# Patient Record
Sex: Female | Born: 1975 | Race: White | Hispanic: No | Marital: Married | Smoking: Never smoker
Health system: Southern US, Community
[De-identification: ages and names within clinical notes are randomized; demographics above are authoritative.]

## PROBLEM LIST (undated history)

## (undated) DIAGNOSIS — R7303 Prediabetes: Secondary | ICD-10-CM

## (undated) DIAGNOSIS — Z9889 Other specified postprocedural states: Secondary | ICD-10-CM

## (undated) DIAGNOSIS — I10 Essential (primary) hypertension: Secondary | ICD-10-CM

---

## 2011-01-10 ENCOUNTER — Ambulatory Visit: Payer: Self-pay | Admitting: Internal Medicine

## 2013-07-29 IMAGING — CT CT ABD-PELV W/ CM
1 of 3 series · 14 of 32 positions shown, 19 images · non-contrast
Comparison: none

REASON FOR EXAM: rlq pain abn CT
COMMENTS:

PROCEDURE:     CT  - CT ABDOMEN / PELVIS  W  - January 10, 2011  [DATE]
RESULT:     CT abdomen and pelvis dated [id number].
TECHNIQUE: Helical 3 mm sections were obtained from the lung bases through
the pubic symphysis status post intravenous administration of 100 mL of
Dsovue-9YP and oral contrast.

[Series 2: 3mm soft tissue · axial · 0.77mm/px · z∈[-538,-102]mm · 14 of 165 slices shown, 19 images]
[im 10/165  soft-tissue]
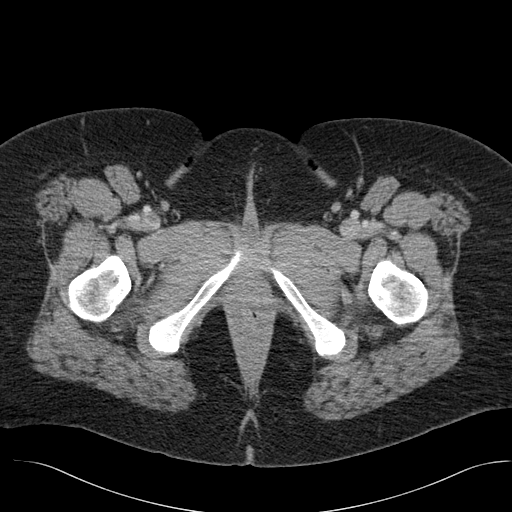
[im 10/165  bone]
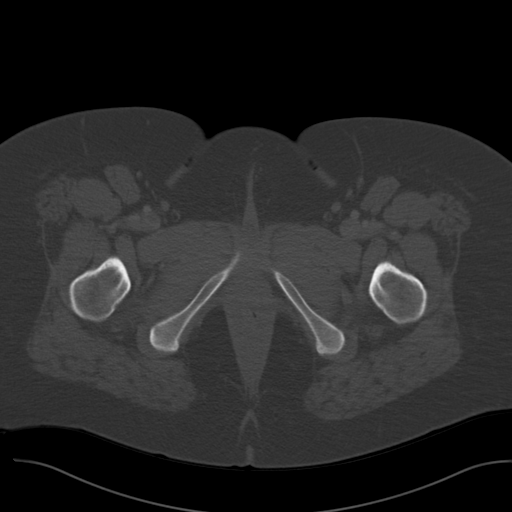
[im 20/165  soft-tissue]
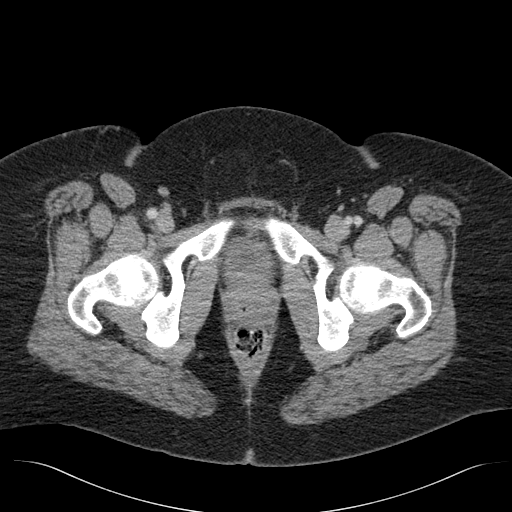
[im 39/165  soft-tissue]
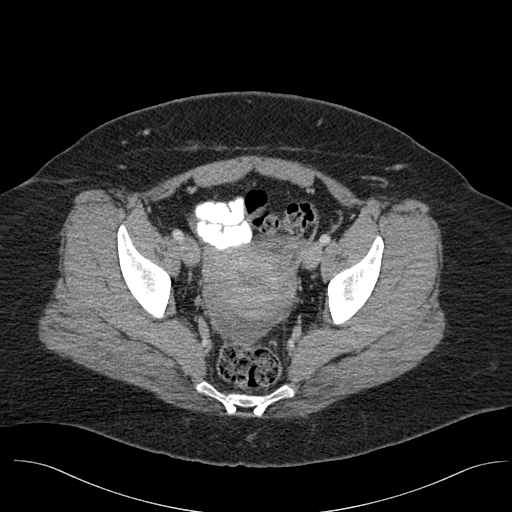
[im 49/165  soft-tissue]
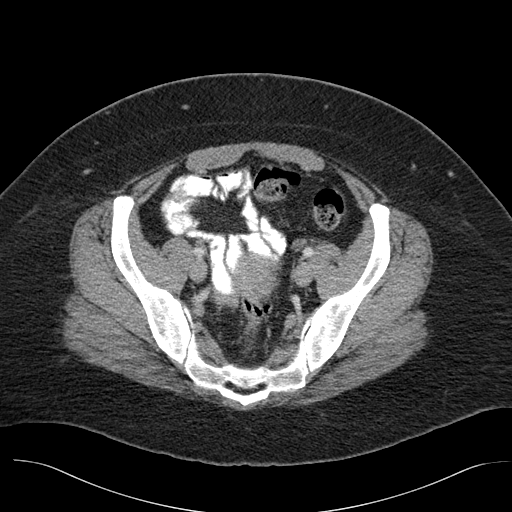
[im 58/165  soft-tissue]
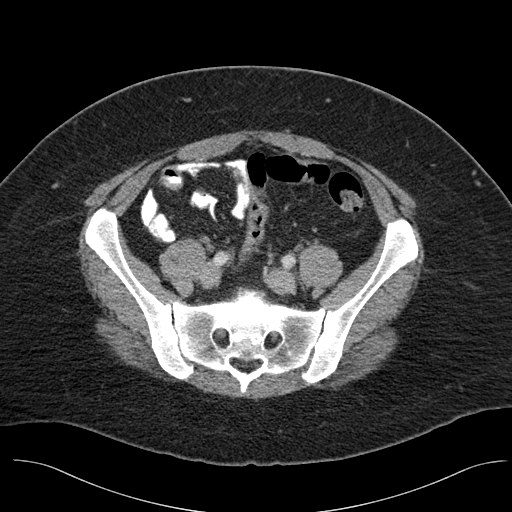
[im 68/165  soft-tissue]
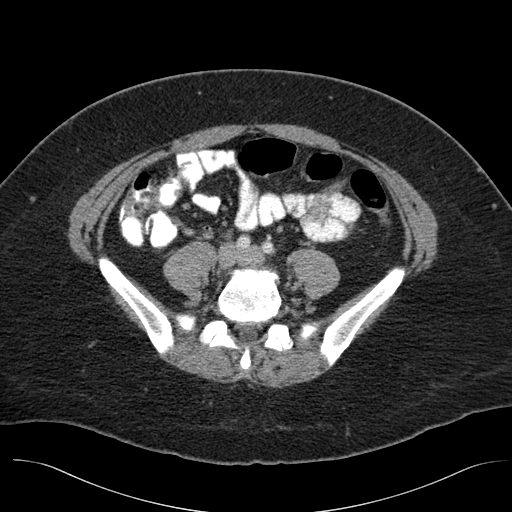
[im 87/165  soft-tissue]
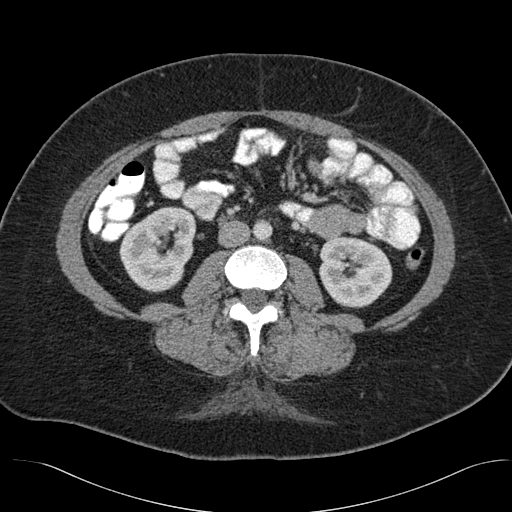
[im 97/165  soft-tissue]
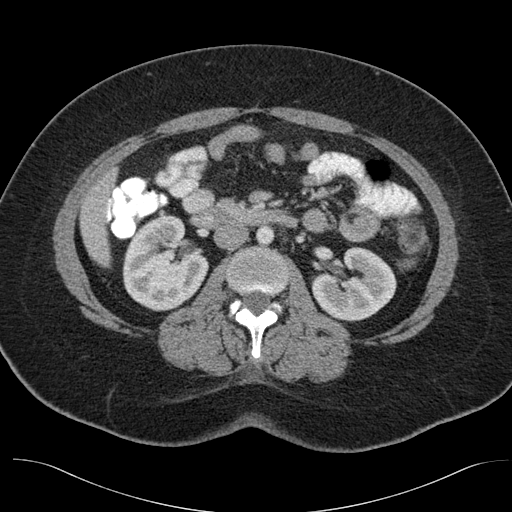
[im 107/165  soft-tissue]
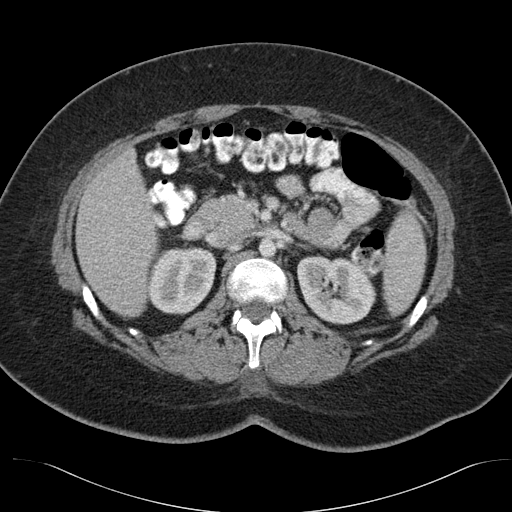
[im 107/165  bone]
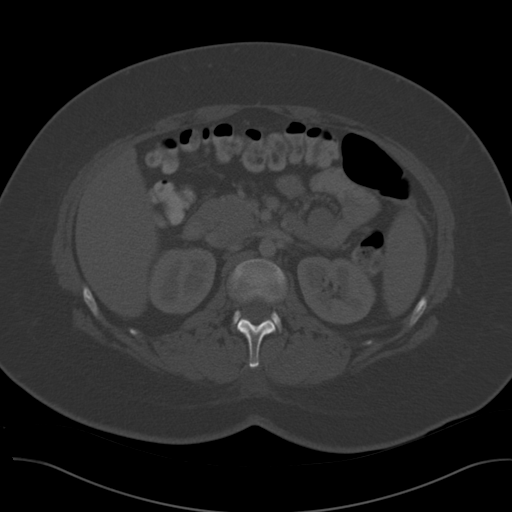
[im 116/165  soft-tissue]
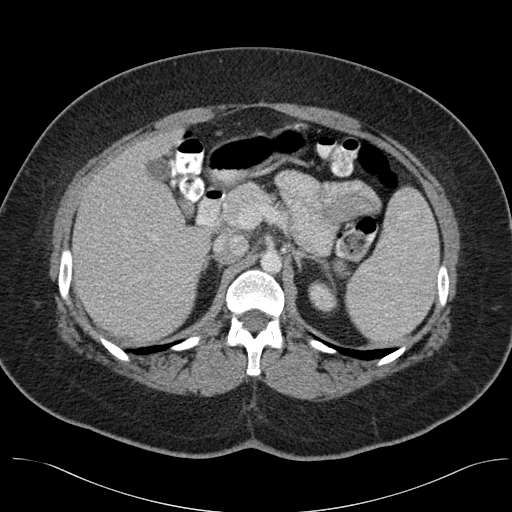
[im 126/165  soft-tissue]
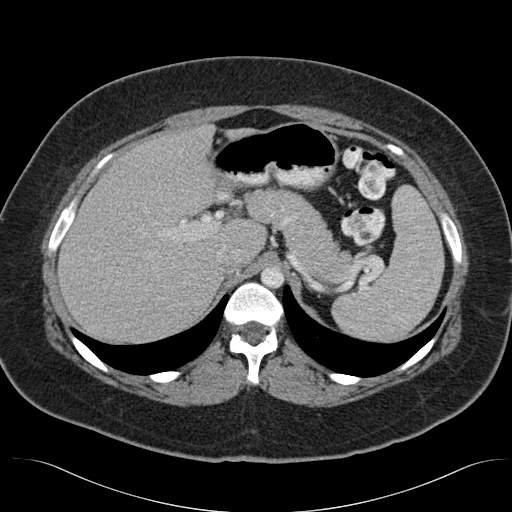
[im 126/165  lung]
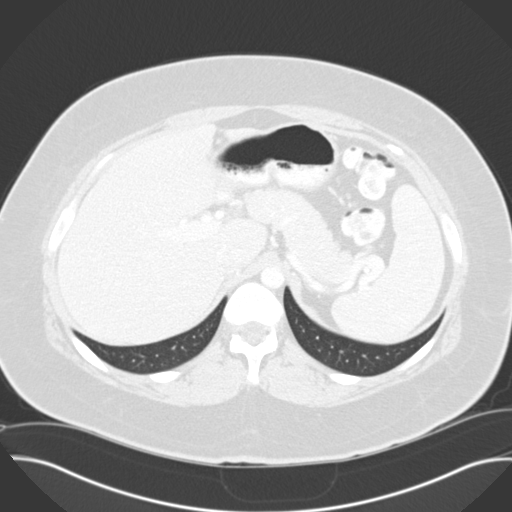
[im 136/165  lung]
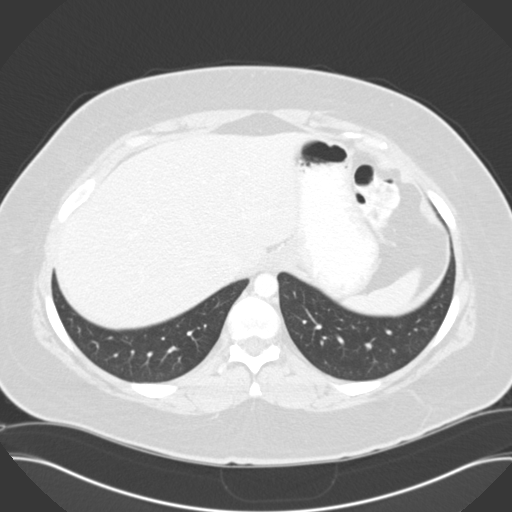
[im 145/165  soft-tissue]
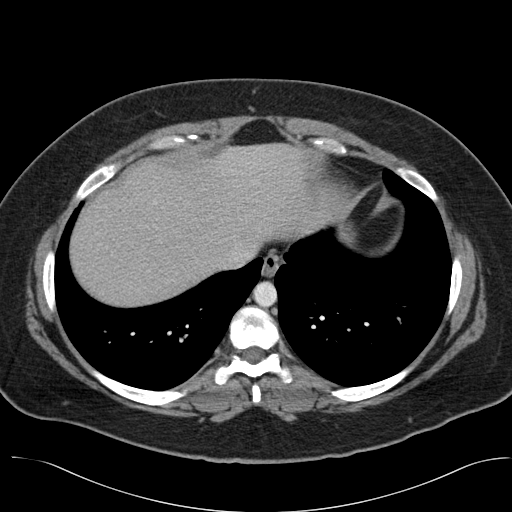
[im 145/165  lung]
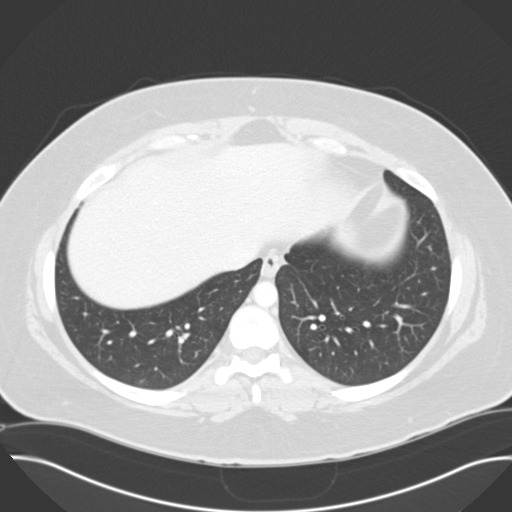
[im 155/165  soft-tissue]
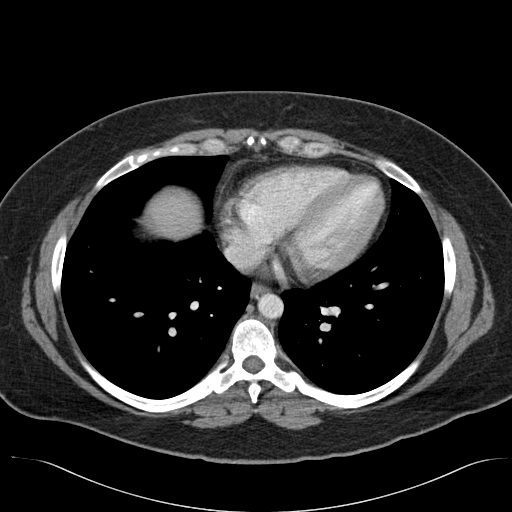
[im 155/165  lung]
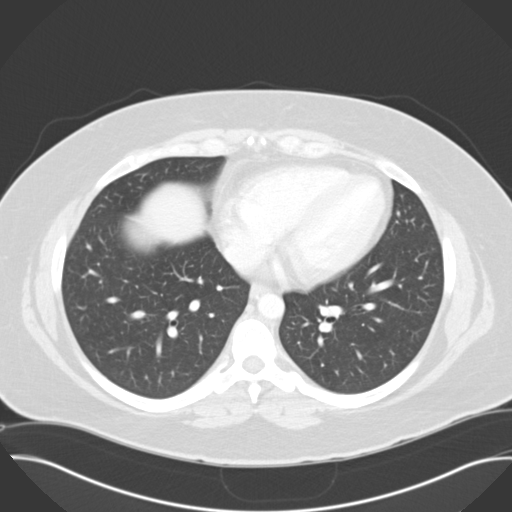

[14 of 32 positions shown; findings below may reference images not displayed]

FINDINGS: The lung bases are unremarkable. The liver, spleen, adrenals,
pancreas, kidneys are unremarkable.

The findings question in the right paracolic gutter region on the prior
noncontrasted CT are benign. The appendix is identified curled upon itself
in this area without evidence of appendiceal inflammatory changes, free
fluid nor dilation. The appendix is air and contrast-filled at 5.3 mm in
diameter.

There is no CT evidence of bowel obstruction nor secondary signs reflecting
enteritis, colitis, diverticulitis. There is no evidence of abdominal aortic
aneurysm nor dissection. The celiac, SMA, IMA, portal vein are opacified.

A small amount of fluid is appreciated within the pelvis likely physiologic.
Alternatively prior richer of an ovarian cyst cannot be excluded. There is
no CT evidence reflecting the sequela of an ovarian cyst at this time.
IMPRESSION: No CT evidence of appendicitis. To findings described on
previous CT in the right paracolic gutter region appear to correspond to
normal abdominal structures.
2. Fluid within the pelvis likely reflecting a physiologic fluid though
rupture of a prior ovarian cyst is also diagnostic consideration.
3. Otherwise no evidence of obstructive or inflammatory abnormalities.

## 2017-02-05 ENCOUNTER — Ambulatory Visit
Admission: EM | Admit: 2017-02-05 | Discharge: 2017-02-05 | Disposition: A | Discharge status: Home / Self Care | Payer: BLUE CROSS/BLUE SHIELD | Attending: Family Medicine | Admitting: Family Medicine

## 2017-02-05 ENCOUNTER — Other Ambulatory Visit: Payer: Self-pay

## 2017-02-05 DIAGNOSIS — J019 Acute sinusitis, unspecified: Secondary | ICD-10-CM | POA: Diagnosis not present

## 2017-02-05 MED ORDER — DOXYCYCLINE HYCLATE 100 MG PO CAPS: 100.0000 mg | ORAL_CAPSULE | Freq: Two times a day (BID) | ORAL | 0 refills | Status: DC

## 2017-02-05 NOTE — Discharge Instructions (Signed)
Antibiotic as prescribed. ° °I hope you feel better ° °Take care ° °Dr. Kismet Facemire  °

## 2017-02-05 NOTE — ED Triage Notes (Signed)
Patient c/o cough and congestion x 6 days, she states when she woke up this morning she felt like the right side of her face was "clogged".

## 2017-02-05 NOTE — ED Provider Notes (Signed)
[facility]    CSN: [id number] Arrival date & time: 02/05/17  0810  History   Chief Complaint Chief Complaint  Patient presents with  . Nasal Congestion    HPI   42 year old female presents with sinus pressure, congestion, and cough.  Patient states that she has been sick for the past 6 days.  She has had known sick contacts as her children have been sick with similar symptoms.  Patient reports ongoing sinus pressure and congestion.  Nonproductive cough.  She states congestion has been worsening.  She states that it is predominantly on the right side.  She reports discolored nasal discharge, green in coloration.  No ear pain.  No fever.  She is been taking Tylenol sinus with minimal improvement.  No known exacerbating factors.  No other associated symptoms.  No other complaints at this time.  PMH - Morbid obesity  History reviewed. No pertinent past medical history.  Past Surgical History:  Procedure Laterality Date  . CESAREAN SECTION     OB History    No data available      Home Medications    Family History No reported family hx.  Social History Social History   Tobacco Use  . Smoking status: Never Smoker  . Smokeless tobacco: Never Used  Substance Use Topics  . Alcohol use: No    Frequency: Never  . Drug use: No    Allergies   Penicillins  Review of Systems Review of Systems  Constitutional: Negative for fever.  HENT: Positive for congestion, sinus pressure and sinus pain. Negative for ear pain.   Respiratory: Positive for cough.    Physical Exam Triage Vital Signs ED Triage Vitals  Enc Vitals Group     BP 02/05/17 0825 139/69     Pulse Rate 02/05/17 0825 72     Resp --      Temp 02/05/17 0825 98.3 F (36.8 C)     Temp Source 02/05/17 0825 Oral     SpO2 02/05/17 0825 100 %     Weight 02/05/17 0826 265 lb (120.2 kg)     Height 02/05/17 0826 5\' 3"  (1.6 m)     Head Circumference --      Peak Flow --      Pain Score --      Pain  Loc --      Pain Edu? --      Excl. in GC? --    Updated Vital Signs BP 139/69 (BP Location: Left Arm)   Pulse 72   Temp 98.3 F (36.8 C) (Oral)   Ht 5\' 3"  (1.6 m)   Wt 265 lb (120.2 kg)   LMP 01/29/2017 (Approximate)   SpO2 100%   BMI 46.94 kg/m    Physical Exam  Constitutional: She is oriented to person, place, and time. She appears well-developed. No distress.  HENT:  Head: Normocephalic and atraumatic.  Mouth/Throat: Oropharynx is clear and moist.  Normal TMs bilaterally.  Eyes: Conjunctivae are normal. Right eye exhibits no discharge.  Neck: Neck supple.  Cardiovascular: Normal rate and regular rhythm.  No murmur heard. Pulmonary/Chest: Effort normal and breath sounds normal. She has no wheezes. She has no rales.  Lymphadenopathy:    She has no cervical adenopathy.  Neurological: She is alert and oriented to person, place, and time.  Skin: Skin is warm. No rash noted.  Psychiatric: She has a normal mood and affect. Her behavior is normal.  Nursing note and vitals reviewed.  UC Treatments /  Results  Labs (all labs ordered are listed, but only abnormal results are displayed) Labs Reviewed - No data to display  EKG  EKG Interpretation None       Radiology No results found.  Procedures Procedures (including critical care time)  Medications Ordered in UC Medications - No data to display   Initial Impression / Assessment and Plan / UC Course  I have reviewed the triage vital signs and the nursing notes.  Pertinent labs & imaging results that were available during my care of the patient were reviewed by me and considered in my medical decision making (see chart for details).     42 year old female presents with sinusitis.  Treating with doxycycline.  Final Clinical Impressions(s) / UC Diagnoses   Final diagnoses:  Acute non-recurrent sinusitis, unspecified location    ED Discharge Orders        Ordered    doxycycline (VIBRAMYCIN) 100 MG capsule   2 times daily     02/05/17 0838     Controlled Substance Prescriptions [place] Controlled Substance Registry consulted? Not Applicable   Tommie Sams, [place] 02/05/17 [phone]

## 2017-02-08 ENCOUNTER — Telehealth: Payer: Self-pay | Admitting: Emergency Medicine

## 2017-02-08 NOTE — Telephone Encounter (Signed)
Left message to follow-up with patient to see how she was doing [facility]

## 2017-02-20 ENCOUNTER — Other Ambulatory Visit: Payer: Self-pay

## 2017-02-20 ENCOUNTER — Ambulatory Visit
Admission: EM | Admit: 2017-02-20 | Discharge: 2017-02-20 | Disposition: A | Discharge status: Home / Self Care | Payer: BLUE CROSS/BLUE SHIELD | Attending: Emergency Medicine | Admitting: Emergency Medicine

## 2017-02-20 DIAGNOSIS — H6121 Impacted cerumen, right ear: Secondary | ICD-10-CM | POA: Diagnosis not present

## 2017-02-20 DIAGNOSIS — H66011 Acute suppurative otitis media with spontaneous rupture of ear drum, right ear: Secondary | ICD-10-CM

## 2017-02-20 MED ORDER — CEFDINIR 300 MG PO CAPS: 300.0000 mg | ORAL_CAPSULE | Freq: Two times a day (BID) | ORAL | 0 refills | Status: AC

## 2017-02-20 MED ORDER — IBUPROFEN 600 MG PO TABS: 600.0000 mg | ORAL_TABLET | Freq: Four times a day (QID) | ORAL | 0 refills | Status: AC | PRN

## 2017-02-20 NOTE — Discharge Instructions (Signed)
600 mg of ibuprofen with 1000 mg of Tylenol together 3-4 times a day as needed for pain.  Do not get your ear wet.  Follow-up with Dr. Jenne CampusMcQueen, ENT, in a week to make sure that she eardrum is healing and does not need any further intervention.  Go to the ER if you get worse or for any signs of allergic reaction to the antibiotics that you are on.

## 2017-02-20 NOTE — ED Triage Notes (Signed)
Patient was seen 02/05/17 for sinus infection, improved while on antibiotics but symptoms returned after completing antibiotics. Patient states last night her right ear also started hurting. Patient c/o right ear pain and congestion. Patient is currently on a z-pak.

## 2017-02-20 NOTE — ED Provider Notes (Signed)
HPI  SUBJECTIVE:  Marie Baker is a 42 y.o. female who presents with constant sharp, throbbing, stabbing right ear pain starting last night.  She reports decreased hearing, she states that she feels fluid in her right ear.  She tried ibuprofen 800 mg with temporary pain reduction.  Also tried Zyrtec.  No aggravating factors.  It is not associated with chewing or yawning.  She continues to have nasal congestion, postnasal drip, coughing that she has had for approximately 2 months.  She is currently on a Z-Pak day #4 for a pharyngitis.  She denies fevers, otorrhea, facial rash.  No sore throat currently.  No recent swimming.  No foreign body insertion.  She does not wear earplugs.  No nasal steroids currently.  Past medical history negative for diabetes, hypertension, TMJ disorder.  She has seasonal allergies.  LMP: 2 weeks ago.  Denies the possibility being pregnant.  ZOX:[id number], Janece Canterbury, MD   History reviewed. No pertinent past medical history.  Past Surgical History:  Procedure Laterality Date  . CESAREAN SECTION      History reviewed. No pertinent family history.  Social History   Tobacco Use  . Smoking status: Never Smoker  . Smokeless tobacco: Never Used  Substance Use Topics  . Alcohol use: No    Frequency: Never  . Drug use: No    No current facility-administered medications for this encounter.   Current Outpatient Medications:  .  cefdinir (OMNICEF) 300 MG capsule, Take 1 capsule (300 mg total) by mouth 2 (two) times daily for 10 days., Disp: 20 capsule, Rfl: 0 .  ibuprofen (ADVIL,MOTRIN) 600 MG tablet, Take 1 tablet (600 mg total) by mouth every 6 (six) hours as needed., Disp: 30 tablet, Rfl: 0  Allergies  Allergen Reactions  . Penicillins Hives     ROS  As noted in HPI.   Physical Exam  BP (!) 129/58 (BP Location: Left Arm)   Pulse 78   Temp 97.8 F (36.6 C) (Oral)   Ht 5\' 3"  (1.6 m)   Wt 265 lb (120.2 kg)   LMP 01/29/2017 (Approximate)   SpO2 100%   BMI  46.94 kg/m   Constitutional: Well developed, well nourished, no acute distress Eyes:  EOMI, conjunctiva normal bilaterally HENT: Normocephalic, atraumatic,mucus membranes moist left TM normal.  Right external ear normal.  No tenderness over the mastoid.  No pain with traction on the pinna or palpation of tragus.  Right external ear canal erythematous.  No rash.  Right TM obscured by cerumen.  Post irrigation right TM erythematous, dull, bulging.  Positive perforation.  No otorrhea.  Neck: No cervical lymphadenopathy Respiratory: Normal inspiratory effort Cardiovascular: Normal rate GI: nondistended skin: No rash, skin intact Musculoskeletal: no deformities Neurologic: Alert & oriented x 3, no focal neuro deficits Psychiatric: Speech and behavior appropriate   ED Course   Medications - No data to display  Orders Placed This Encounter  Procedures  . Ear wax removal    R ear    Standing Status:   Standing    Number of Occurrences:   1    No results found for this or any previous visit (from the past 24 hour(s)). No results found.  ED Clinical Impression  Acute suppurative otitis media of right ear with spontaneous rupture of tympanic membrane, recurrence not specified   ED Assessment/Plan  Attempted curetting out cerumen from right ear, unable to completely visualize TM will have it irrigated and reevaluate.  With a right-sided otitis media with perforation.  Advised patient to not get her ear wet, will put her on Omnicef for 10 days because azithro is not working , and will have her follow-up with the ENT regarding the perforation Dr. Jenne CampusMcQueen on call.  Also 600 mg ibuprofen with 1 g of Tylenol 3-4 times a day as needed for pain.  Advised Flonase because she has had the nasal congestion for 2 months, think that this probably allergies.  Patient states that the penicillin allergy was a rash when she was 42 years old.  She states that she has had to "penicillin-based shots"  since then and has tolerated them well.  She [facility]  Discussed with her the low cross reactivity between penicillins and third generation cephalosporins.  Will go to the ER for any signs of anaphylaxis.    Discussed  MDM, plan and followup with patient.. patient agrees with plan.   Meds ordered this encounter  Medications  . cefdinir (OMNICEF) 300 MG capsule    Sig: Take 1 capsule (300 mg total) by mouth 2 (two) times daily for 10 days.    Dispense:  20 capsule    Refill:  0  . ibuprofen (ADVIL,MOTRIN) 600 MG tablet    Sig: Take 1 tablet (600 mg total) by mouth every 6 (six) hours as needed.    Dispense:  30 tablet    Refill:  0    *This clinic note was created using Scientist, clinical (histocompatibility and immunogenetics)Dragon dictation software. Therefore, there may be occasional mistakes despite careful proofreading.   ?   Domenick GongMortenson, Jayesh Marbach, MD 02/20/17 [phone]

## 2017-02-23 ENCOUNTER — Telehealth: Payer: Self-pay

## 2017-02-23 NOTE — Telephone Encounter (Signed)
Called to follow up with patient since visit here at [facility]. Patient instructed to call back with any questions or concerns. MAH  

## 2017-03-19 ENCOUNTER — Encounter: Payer: Self-pay | Admitting: Emergency Medicine

## 2017-03-19 ENCOUNTER — Ambulatory Visit
Admission: EM | Admit: 2017-03-19 | Discharge: 2017-03-19 | Disposition: A | Discharge status: Home / Self Care | Payer: BLUE CROSS/BLUE SHIELD | Attending: Orthopedic Surgery | Admitting: Orthopedic Surgery

## 2017-03-19 ENCOUNTER — Other Ambulatory Visit: Payer: Self-pay

## 2017-03-19 DIAGNOSIS — H1032 Unspecified acute conjunctivitis, left eye: Secondary | ICD-10-CM

## 2017-03-19 MED ORDER — CIPROFLOXACIN HCL 0.3 % OP SOLN: 1.0000 [drp] | OPHTHALMIC | 0 refills | Status: DC

## 2017-03-19 NOTE — ED Provider Notes (Signed)
[facility]    CSN: [id number] Arrival date & time: 03/19/17  0806     History   Chief Complaint Chief Complaint  Patient presents with  . Eye Problem    APPOINTMENT    HPI Marie Baker is a 42 y.o. female.   Resents to the urgent care facility for evaluation of left eye irritation, drainage, matting.  Patient states this morning she woke up with a red eye with green discharge coming from the left eye.  She denies any eye pain, foreign body sensation or vision changes.  She removed her contact lenses and has seen improvement with eye irritation.  She is currently not with any pain.  She denies any right eye symptoms.  HPI  History reviewed. No pertinent past medical history.  There are no active problems to display for this patient.   Past Surgical History:  Procedure Laterality Date  . CESAREAN SECTION      OB History    No data available       Home Medications    Prior to Admission medications   Medication Sig Start Date End Date Taking? Authorizing Provider  ciprofloxacin (CILOXAN) 0.3 % ophthalmic solution Place 1 drop into both eyes every 2 (two) hours. Administer 1 drop, every 2 hours, while awake, for 2 days. Then 1 drop, every 4 hours, while awake, for the next 5 days. 03/19/17   Evon SlackGaines, Thomas C, PA-C  ibuprofen (ADVIL,MOTRIN) 600 MG tablet Take 1 tablet (600 mg total) by mouth every 6 (six) hours as needed. 02/20/17   Domenick GongMortenson, Ashley, MD    Family History History reviewed. No pertinent family history.  Social History Social History   Tobacco Use  . Smoking status: Never Smoker  . Smokeless tobacco: Never Used  Substance Use Topics  . Alcohol use: No    Frequency: Never  . Drug use: No     Allergies   Penicillins   Review of Systems Review of Systems  Constitutional: Negative for fever.  Eyes: Positive for discharge and redness. Negative for photophobia, pain, itching and visual disturbance.  Skin: Negative for rash.      Physical Exam Triage Vital Signs ED Triage Vitals  Enc Vitals Group     BP 03/19/17 0819 136/90     Pulse Rate 03/19/17 0819 71     Resp 03/19/17 0819 16     Temp 03/19/17 0819 98.2 F (36.8 C)     Temp Source 03/19/17 0819 Oral     SpO2 03/19/17 0819 98 %     Weight 03/19/17 0817 265 lb (120.2 kg)     Height 03/19/17 0817 5\' 3"  (1.6 m)     Head Circumference --      Peak Flow --      Pain Score 03/19/17 0817 2     Pain Loc --      Pain Edu? --      Excl. in GC? --    No data found.  Updated Vital Signs BP 136/90 (BP Location: Right Arm)   Pulse 71   Temp 98.2 F (36.8 C) (Oral)   Resp 16   Ht 5\' 3"  (1.6 m)   Wt 265 lb (120.2 kg)   LMP 03/12/2017 (Approximate)   SpO2 98%   BMI 46.94 kg/m   Visual Acuity Right Eye Distance: 20/20 corrected Left Eye Distance: 20/25 corrected Bilateral Distance:    Right Eye Near:   Left Eye Near:    Bilateral Near:  Physical Exam  Constitutional: She is oriented to person, place, and time. She appears well-developed and well-nourished.  HENT:  Head: Normocephalic and atraumatic.  Eyes: EOM are normal. Pupils are equal, round, and reactive to light.  Left eye is erythematous along the conjunctival area with no  visible foreign body noted in the eye.  Patient has normal extraocular movements and pupils equal round and reactive to light.  No facial rashes or lesions.  Neck: Normal range of motion.  Cardiovascular: Normal rate.  Pulmonary/Chest: Effort normal. No respiratory distress.  Neurological: She is alert and oriented to person, place, and time.     UC Treatments / Results  Labs (all labs ordered are listed, but only abnormal results are displayed) Labs Reviewed - No data to display  EKG  EKG Interpretation None       Radiology No results found.  Procedures Procedures (including critical care time)  Medications Ordered in UC Medications - No data to display   Initial Impression / Assessment  and Plan / UC Course  I have reviewed the triage vital signs and the nursing notes.  Pertinent labs & imaging results that were available during my care of the patient were reviewed by me and considered in my medical decision making (see chart for details).     42 year old female with left eye redness, drainage.  She is a contact lens wearer, will have her discontinue contacts for 1 week and start Ciloxan eyedrops for bacterial conjunctivitis.  No signs of facial rash.  Will have patient follow-up with ophthalmologist if no improvement.  Final Clinical Impressions(s) / UC Diagnoses   Final diagnoses:  Acute bacterial conjunctivitis of left eye    ED Discharge Orders        Ordered    ciprofloxacin (CILOXAN) 0.3 % ophthalmic solution  Every 2 hours     03/19/17 0837        Evon Slack, PA-C 03/19/17 [phone]

## 2017-03-19 NOTE — Discharge Instructions (Signed)
Please avoid contact lenses for 1 week.  Please complete 7-day course of topical eye antibiotic.  If no improvement in 3-4 days, follow-up with ophthalmologist.  Return to the clinic for any worsening symptoms or urgent changes in her health.

## 2017-03-19 NOTE — ED Triage Notes (Signed)
Patient c/o redness and drainage in her left eye that started on Friday.

## 2019-04-23 ENCOUNTER — Other Ambulatory Visit: Payer: Self-pay

## 2019-04-23 ENCOUNTER — Ambulatory Visit: Payer: Self-pay | Attending: Internal Medicine

## 2019-04-23 DIAGNOSIS — Z23 Encounter for immunization: Secondary | ICD-10-CM

## 2019-04-23 NOTE — Progress Notes (Signed)
   Covid-19 Vaccination Clinic  Name:  Kourtlyn Charlet    MRN: [id number] DOB: 1975/02/26  04/23/2019  Ms. Rawling was observed post Covid-19 immunization for 15 minutes without incident. She was provided with Vaccine Information Sheet and instruction to access the V-Safe system.   Ms. Hodgkiss was instructed to call 911 with any severe reactions post vaccine: Marland Kitchen Difficulty breathing  . Swelling of face and throat  . A fast heartbeat  . A bad rash all over body  . Dizziness and weakness   Immunizations Administered    Name Date Dose VIS Date Route   Pfizer COVID-19 Vaccine 04/23/2019 10:50 AM 0.3 mL 01/05/2019 Intramuscular   Manufacturer: [employer], [employer]   Lot: [id number]   NDC: [address]-8

## 2019-05-16 ENCOUNTER — Ambulatory Visit: Payer: Self-pay | Attending: Internal Medicine

## 2019-05-16 DIAGNOSIS — Z23 Encounter for immunization: Secondary | ICD-10-CM

## 2019-05-16 NOTE — Progress Notes (Signed)
   Covid-19 Vaccination Clinic  Name:  Galia Rahm    MRN: [id number] DOB: 1975/04/02  05/16/2019  Ms. Hunkele was observed post Covid-19 immunization for 15 minutes without incident. She was provided with Vaccine Information Sheet and instruction to access the V-Safe system.   Ms. Stretch was instructed to call 911 with any severe reactions post vaccine: Marland Kitchen Difficulty breathing  . Swelling of face and throat  . A fast heartbeat  . A bad rash all over body  . Dizziness and weakness   Immunizations Administered    Name Date Dose VIS Date Route   Pfizer COVID-19 Vaccine 05/16/2019 12:36 PM 0.3 mL 03/21/2018 Intramuscular   Manufacturer: [employer], [employer]   Lot: [id number]   NDC: [address]-8

## 2020-06-13 ENCOUNTER — Ambulatory Visit
Admission: EM | Admit: 2020-06-13 | Discharge: 2020-06-13 | Disposition: A | Discharge status: Home / Self Care | Payer: Managed Care, Other (non HMO) | Attending: Family Medicine | Admitting: Family Medicine

## 2020-06-13 ENCOUNTER — Other Ambulatory Visit: Payer: Self-pay

## 2020-06-13 DIAGNOSIS — H1032 Unspecified acute conjunctivitis, left eye: Secondary | ICD-10-CM | POA: Diagnosis not present

## 2020-06-13 MED ORDER — CIPROFLOXACIN HCL 0.3 % OP SOLN: OPHTHALMIC | 0 refills | Status: DC

## 2020-06-13 NOTE — ED Triage Notes (Addendum)
Pt c/o redness and irritation to her left eye for several days. Pt states she woke up with some yellowish crusting this morning. Pt also states it is not very itchy, just feels irritated.

## 2020-06-13 NOTE — ED Provider Notes (Signed)
[facility]    CSN: [id number] Arrival date & time: 06/13/20  0808  History   Chief Complaint Chief Complaint  Patient presents with  . Eye Problem   HPI  45 year old female presents with the above complaint.  Patient reports eye redness/irritation for the last 2 days. Crusting this am. She is a contact lens wearer.  She has removed her contact lenses and has not had any improvement.  No reports of contacts.  No relieving factors.  No other associated symptoms.  No other complaints.  Home Medications    Prior to Admission medications   Medication Sig Start Date End Date Taking? Authorizing Provider  amLODipine (NORVASC) 5 MG tablet Take by mouth. 05/28/20  Yes [provider]  ciprofloxacin (CILOXAN) 0.3 % ophthalmic solution Administer 1 drop, every 2 hours, while awake, for 2 days. Then 1 drop, every 4 hours, while awake, for the next 5 days. 06/13/20  Yes Iness Pangilinan G, DO  NORTREL 1/35, 28, tablet Take 1 tablet by mouth daily. 05/28/20  Yes [provider]  ibuprofen (ADVIL,MOTRIN) 600 MG tablet Take 1 tablet (600 mg total) by mouth every 6 (six) hours as needed. 02/20/17   Domenick Gong, MD   Social History Social History   Tobacco Use  . Smoking status: Never Smoker  . Smokeless tobacco: Never Used  Vaping Use  . Vaping Use: Never used  Substance Use Topics  . Alcohol use: No  . Drug use: No     Allergies   Penicillins   Review of Systems Review of Systems  Constitutional: Negative.   Eyes: Positive for discharge and redness.   Physical Exam Triage Vital Signs ED Triage Vitals  Enc Vitals Group     BP 06/13/20 0820 (!) 148/83     Pulse Rate 06/13/20 0820 73     Resp 06/13/20 0820 18     Temp 06/13/20 0820 98.2 F (36.8 C)     Temp Source 06/13/20 0820 Oral     SpO2 06/13/20 0820 100 %     Weight 06/13/20 0818 280 lb (127 kg)     Height 06/13/20 0818 5\' 3"  (1.6 m)     Head Circumference --      Peak Flow --      Pain  Score 06/13/20 0818 0     Pain Loc --      Pain Edu? --      Excl. in GC? --    Updated Vital Signs BP (!) 148/83 (BP Location: Left Arm)   Pulse 73   Temp 98.2 F (36.8 C) (Oral)   Resp 18   Ht 5\' 3"  (1.6 m)   Wt 127 kg   LMP 06/04/2020   SpO2 100%   BMI 49.60 kg/m   Visual Acuity Right Eye Distance: 20/25 Left Eye Distance: 20/30 Bilateral Distance: 20/25  Right Eye Near:   Left Eye Near:    Bilateral Near:     Physical Exam Vitals and nursing note reviewed.  Constitutional:      General: She is not in acute distress.    Appearance: Normal appearance. She is not ill-appearing.  HENT:     Head: Normocephalic and atraumatic.  Eyes:     Comments: Left eye with conjunctival injection.   Cardiovascular:     Rate and Rhythm: Normal rate and regular rhythm.  Pulmonary:     Effort: Pulmonary effort is normal.     Breath sounds: Normal breath sounds. No  wheezing, rhonchi or rales.  Neurological:     Mental Status: She is alert.  Psychiatric:        Mood and Affect: Mood normal.        Behavior: Behavior normal.    UC Treatments / Results  Labs (all labs ordered are listed, but only abnormal results are displayed) Labs Reviewed - No data to display  EKG   Radiology No results found.  Procedures Procedures (including critical care time)  Medications Ordered in UC Medications - No data to display  Initial Impression / Assessment and Plan / UC Course  I have reviewed the triage vital signs and the nursing notes.  Pertinent labs & imaging results that were available during my care of the patient were reviewed by me and considered in my medical decision making (see chart for details).    45 year old female presents with conjunctivitis.  Treating with ciprofloxacin as she is a contact lens wearer.   Final Clinical Impressions(s) / UC Diagnoses   Final diagnoses:  Acute bacterial conjunctivitis of left eye   Discharge Instructions   None    ED  Prescriptions    Medication Sig Dispense Auth. Provider   ciprofloxacin (CILOXAN) 0.3 % ophthalmic solution Administer 1 drop, every 2 hours, while awake, for 2 days. Then 1 drop, every 4 hours, while awake, for the next 5 days. 5 mL Tommie Sams, DO     PDMP not reviewed this encounter.   Everlene Other [place], [place] 06/13/20 [phone]

## 2023-09-13 ENCOUNTER — Encounter: Payer: Self-pay | Admitting: Gastroenterology

## 2023-09-16 ENCOUNTER — Encounter
Admission: RE | Disposition: A | Discharge status: Home / Self Care | Payer: Self-pay | Source: Home / Self Care | Attending: Gastroenterology

## 2023-09-16 ENCOUNTER — Encounter: Payer: Self-pay | Admitting: Gastroenterology

## 2023-09-16 ENCOUNTER — Other Ambulatory Visit: Payer: Self-pay

## 2023-09-16 ENCOUNTER — Encounter: Payer: Self-pay | Admitting: Anesthesiology

## 2023-09-16 ENCOUNTER — Ambulatory Visit
Admission: RE | Admit: 2023-09-16 | Discharge: 2023-09-16 | Disposition: A | Discharge status: Home / Self Care | Attending: Gastroenterology | Admitting: Gastroenterology

## 2023-09-16 ENCOUNTER — Ambulatory Visit: Payer: Self-pay | Admitting: Anesthesiology

## 2023-09-16 DIAGNOSIS — I10 Essential (primary) hypertension: Secondary | ICD-10-CM | POA: Insufficient documentation

## 2023-09-16 DIAGNOSIS — Z6841 Body Mass Index (BMI) 40.0 and over, adult: Secondary | ICD-10-CM | POA: Insufficient documentation

## 2023-09-16 DIAGNOSIS — Z79899 Other long term (current) drug therapy: Secondary | ICD-10-CM | POA: Insufficient documentation

## 2023-09-16 DIAGNOSIS — F17210 Nicotine dependence, cigarettes, uncomplicated: Secondary | ICD-10-CM | POA: Insufficient documentation

## 2023-09-16 DIAGNOSIS — E669 Obesity, unspecified: Secondary | ICD-10-CM | POA: Insufficient documentation

## 2023-09-16 DIAGNOSIS — Z1211 Encounter for screening for malignant neoplasm of colon: Secondary | ICD-10-CM | POA: Insufficient documentation

## 2023-09-16 HISTORY — PX: COLONOSCOPY: SHX5424

## 2023-09-16 HISTORY — DX: Prediabetes: R73.03

## 2023-09-16 HISTORY — DX: Other specified postprocedural states: Z98.890

## 2023-09-16 HISTORY — DX: Essential (primary) hypertension: I10

## 2023-09-16 LAB — POCT PREGNANCY, URINE: Preg Test, Ur: NEGATIVE

## 2023-09-16 SURGERY — COLONOSCOPY
Anesthesia: General

## 2023-09-16 MED ORDER — DEXMEDETOMIDINE HCL IN NACL 200 MCG/50ML IV SOLN
INTRAVENOUS | Status: DC | PRN
  Administered 2023-09-16: 4 ug via INTRAVENOUS
  Administered 2023-09-16: 8 ug via INTRAVENOUS

## 2023-09-16 MED ORDER — SODIUM CHLORIDE 0.9 % IV SOLN: INTRAVENOUS | Status: DC

## 2023-09-16 MED ORDER — LIDOCAINE HCL (CARDIAC) PF 100 MG/5ML IV SOSY
PREFILLED_SYRINGE | INTRAVENOUS | Status: DC | PRN
  Administered 2023-09-16: 50 mg via INTRAVENOUS

## 2023-09-16 MED ORDER — PROPOFOL 10 MG/ML IV BOLUS
INTRAVENOUS | Status: DC | PRN
  Administered 2023-09-16: 20 mg via INTRAVENOUS
  Administered 2023-09-16: 80 mg via INTRAVENOUS
  Administered 2023-09-16: 150 ug/kg/min via INTRAVENOUS

## 2023-09-16 NOTE — Anesthesia Preprocedure Evaluation (Signed)
 Anesthesia Evaluation  Patient identified by MRN, date of birth, ID band Patient awake    Reviewed: Allergy & Precautions, NPO status , Patient's Chart, lab work & pertinent test results  History of Anesthesia Complications (+) PONV and history of anesthetic complications  Airway Mallampati: II  TM Distance: >3 FB Neck ROM: Full    Dental  (+) Teeth Intact   Pulmonary neg pulmonary ROS, Current Smoker and Patient abstained from smoking.   Pulmonary exam normal breath sounds clear to auscultation       Cardiovascular Exercise Tolerance: Good hypertension, Pt. on medications negative cardio ROS Normal cardiovascular exam Rhythm:Regular Rate:Normal     Neuro/Psych negative neurological ROS  negative psych ROS   GI/Hepatic negative GI ROS, Neg liver ROS,,,  Endo/Other  negative endocrine ROS  Class 4 obesity  Renal/GU negative Renal ROS  negative genitourinary   Musculoskeletal negative musculoskeletal ROS (+)    Abdominal  (+) + obese  Peds negative pediatric ROS (+)  Hematology negative hematology ROS (+)   Anesthesia Other Findings Past Medical History: No date: Hypertension No date: PONV (postoperative nausea and vomiting)     Comment:  for 2nd C/S she had to have Gen anesthesia- she passed               out and had convulsing type reaction to epidural..cleared              by neurology as no seizure disorder No date: Pre-diabetes  Past Surgical History: 2006: CESAREAN SECTION; N/A     Comment:  epidural 2014: CESAREAN SECTION; N/A     Comment:  general  BMI    Body Mass Index: 49.32 kg/m      Reproductive/Obstetrics negative OB ROS                              Anesthesia Physical Anesthesia Plan  ASA: 2  Anesthesia Plan: General   Post-op Pain Management:    Induction:   PONV Risk Score and Plan: Propofol  infusion and TIVA  Airway Management Planned: Natural  Airway and Nasal Cannula  Additional Equipment:   Intra-op Plan:   Post-operative Plan:   Informed Consent: I have reviewed the patients History and Physical, chart, labs and discussed the procedure including the risks, benefits and alternatives for the proposed anesthesia with the patient or authorized representative who has indicated his/her understanding and acceptance.     Dental Advisory Given  Plan Discussed with: CRNA  Anesthesia Plan Comments:          Anesthesia Quick Evaluation

## 2023-09-16 NOTE — Op Note (Signed)
 [facility] Patient Name: Marie Baker Procedure Date: 09/16/2023 11:10 AM MRN: [id number] Account #: [id number] Date of Birth: 1975/03/01 Admit Type: Outpatient Age: 48 Room: [facility] ENDO ROOM 4 Gender: Female Note Status: Finalized Instrument Name: Colon Scope [id number] Procedure:             Colonoscopy Indications:           Screening for colorectal malignant neoplasm, This is                         the patient's first colonoscopy Providers:             Corinn Jess Brooklyn MD, MD Referring MD:          Steva Clotilda DEL, NP Medicines:             General Anesthesia Complications:         No immediate complications. Estimated blood loss: None. Procedure:             Pre-Anesthesia Assessment:                        - Prior to the procedure, a History and Physical was                         performed, and patient medications and allergies were                         reviewed. The patient is competent. The risks and                         benefits of the procedure and the sedation options and                         risks were discussed with the patient. All questions                         were answered and informed consent was obtained.                         Patient identification and proposed procedure were                         verified by the physician, the nurse, the                         anesthesiologist, the anesthetist and the technician                         in the pre-procedure area in the procedure room in the                         endoscopy suite. Mental Status Examination: alert and                         oriented. Airway Examination: normal oropharyngeal                         airway and neck mobility. Respiratory Examination:  clear to auscultation. CV Examination: normal.                         Prophylactic Antibiotics: The patient does not require                         prophylactic  antibiotics. Prior Anticoagulants: The                         patient has taken no anticoagulant or antiplatelet                         agents. ASA Grade Assessment: II - A patient with mild                         systemic disease. After reviewing the risks and                         benefits, the patient was deemed in satisfactory                         condition to undergo the procedure. The anesthesia                         plan was to use general anesthesia. Immediately prior                         to administration of medications, the patient was                         re-assessed for adequacy to receive sedatives. The                         heart rate, respiratory rate, oxygen saturations,                         blood pressure, adequacy of pulmonary ventilation, and                         response to care were monitored throughout the                         procedure. The physical status of the patient was                         re-assessed after the procedure.                        After obtaining informed consent, the colonoscope was                         passed under direct vision. Throughout the procedure,                         the patient's blood pressure, pulse, and oxygen                         saturations were monitored continuously. The  Colonoscope was introduced through the anus and                         advanced to the the cecum, identified by appendiceal                         orifice and ileocecal valve. The colonoscopy was                         performed without difficulty. The patient tolerated                         the procedure well. The quality of the bowel                         preparation was evaluated using the BBPS [facility] Bowel                         Preparation Scale) with scores of: Right Colon = 3,                         Transverse Colon = 3 and Left Colon = 3 (entire mucosa                         seen  well with no residual staining, small fragments                         of stool or opaque liquid). The total BBPS score                         equals 9. The ileocecal valve, appendiceal orifice,                         and rectum were photographed. Findings:      The perianal and digital rectal examinations were normal. Pertinent       negatives include normal sphincter tone and no palpable rectal lesions.      The entire examined colon appeared normal.      The retroflexed view of the distal rectum and anal verge was normal and       showed no anal or rectal abnormalities. Impression:            - The entire examined colon is normal.                        - The distal rectum and anal verge are normal on                         retroflexion view.                        - No specimens collected. Recommendation:        - Discharge patient to home (with escort).                        - Resume previous diet today.                        -  Continue present medications.                        - Repeat colonoscopy in 10 years for screening                         purposes. Procedure Code(s):     --- Professional ---                        H9878, Colorectal cancer screening; colonoscopy on                         individual not meeting criteria for high risk Diagnosis Code(s):     --- Professional ---                        Z12.11, Encounter for screening for malignant neoplasm                         of colon CPT copyright 2022 American Medical Association. All rights reserved. The codes documented in this report are preliminary and upon coder review may  be revised to meet current compliance requirements. Dr. Corinn Brooklyn Corinn Jess Brooklyn MD, MD 09/16/2023 11:44:13 AM This report has been signed electronically. Number of Addenda: 0 Note Initiated On: 09/16/2023 11:10 AM Scope Withdrawal Time: 0 hours 7 minutes 43 seconds  Total Procedure Duration: 0 hours 12 minutes 10 seconds   Estimated Blood Loss:  Estimated blood loss: none.      [facility]

## 2023-09-16 NOTE — Anesthesia Postprocedure Evaluation (Signed)
 Anesthesia Post Note  Patient: Marie Baker  Procedure(s) Performed: COLONOSCOPY  Patient location during evaluation: PACU Anesthesia Type: General Level of consciousness: awake Pain management: satisfactory to patient Vital Signs Assessment: post-procedure vital signs reviewed and stable Respiratory status: spontaneous breathing Cardiovascular status: stable Anesthetic complications: no   No notable events documented.   Last Vitals:  Vitals:   09/16/23 1145 09/16/23 1155  BP: (!) 140/94 (!) 113/45  Pulse: 83 70  Resp: (!) 23 15  Temp:    SpO2: 99% 98%    Last Pain:  Vitals:   09/16/23 1155  TempSrc:   PainSc: 0-No pain                 VAN STAVEREN,Katelynn Heidler

## 2023-09-16 NOTE — H&P (Signed)
 Marie JONELLE Brooklyn, MD [facility]enter[facility], DHIP [address]  [place], [place] [address]  Main: [phone] Fax:  [phone] Pager: [phone]   Primary Care Physician:  Steva Clotilda DEL, NP Primary Gastroenterologist:  Dr. Corinn JONELLE Baker  Pre-Procedure History & Physical: HPI:  Marie Baker is a 48 y.o. female is here for an colonoscopy.   Past Medical History:  Diagnosis Date   Hypertension    PONV (postoperative nausea and vomiting)    for 2nd C/S she had to have Gen anesthesia- she passed out and had convulsing type reaction to epidural..cleared by neurology as no seizure disorder   Pre-diabetes     Past Surgical History:  Procedure Laterality Date   CESAREAN SECTION N/A 2006   epidural   CESAREAN SECTION N/A 2014   general    Prior to Admission medications   Medication Sig Start Date End Date Taking? Authorizing Provider  atorvastatin (LIPITOR) 20 MG tablet Take 20 mg by mouth daily.   Yes [provider]  Cholecalciferol (D3 HIGH POTENCY) 25 MCG (1000 UT) capsule Take 1,000 Units by mouth daily.   Yes [provider]  cloNIDine (CATAPRES) 0.1 MG tablet Take 0.1 mg by mouth daily.   Yes [provider]  cyanocobalamin (VITAMIN B12) 100 MCG tablet Take 100 mcg by mouth daily.   Yes [provider]  hydrochlorothiazide (HYDRODIURIL) 25 MG tablet Take 25 mg by mouth daily.   Yes [provider]  olmesartan (BENICAR) 40 MG tablet Take 40 mg by mouth daily.   Yes [provider]  ibuprofen  (ADVIL ,MOTRIN ) 600 MG tablet Take 1 tablet (600 mg total) by mouth every 6 (six) hours as needed. 02/20/17   Van Knee, MD    Allergies as of 09/13/2023 - Review Complete 09/13/2023  Allergen Reaction Noted   Penicillins Hives 02/05/2017    History reviewed. No pertinent family history.  Social History   Socioeconomic History   Marital status: Married    Spouse name: Not on file   Number of  children: Not on file   Years of education: Not on file   Highest education level: Not on file  Occupational History   Not on file  Tobacco Use   Smoking status: Some Days    Types: Cigarettes   Smokeless tobacco: Never  Vaping Use   Vaping status: Never Used  Substance and Sexual Activity   Alcohol use: Yes    Comment: socially   Drug use: Never   Sexual activity: Not on file  Other Topics Concern   Not on file  Social History Narrative   Not on file   Social Drivers of Health   Financial Resource Strain: Not on file  Food Insecurity: Not on file  Transportation Needs: Not on file  Physical Activity: Not on file  Stress: Not on file  Social Connections: Not on file  Intimate Partner Violence: Not on file    Review of Systems: See HPI, otherwise negative ROS  Physical Exam: BP (!) 133/57   Pulse 72   Temp (!) 97 F (36.1 C) (Temporal)   Resp 18   Ht 5' 3 (1.6 m)   Wt 126.3 kg   LMP  (LMP Unknown) Comment: pt has IUD  SpO2 100%   BMI 49.32 kg/m  General:   Alert,  pleasant and cooperative in NAD Head:  Normocephalic and atraumatic. Neck:  Supple; no masses or thyromegaly. Lungs:  Clear throughout to auscultation.    Heart:  Regular rate and rhythm. Abdomen:  Soft, nontender and nondistended. Normal bowel sounds, without guarding, and without rebound.   Neurologic:  Alert and  oriented x4;  grossly normal neurologically.  Impression/Plan: Inas Avena is here for an colonoscopy to be performed for colon cancer screening  Risks, benefits, limitations, and alternatives regarding  colonoscopy have been reviewed with the patient.  Questions have been answered.  All parties agreeable.   Marie Brooklyn, MD  09/16/2023, 10:25 AM

## 2023-09-16 NOTE — Transfer of Care (Signed)
 Immediate Anesthesia Transfer of Care Note  Patient: Marie Baker  Procedure(s) Performed: COLONOSCOPY  Patient Location: Endoscopy Unit  Anesthesia Type:General  Level of Consciousness: awake, alert , and oriented  Airway & Oxygen Therapy: Patient Spontanous Breathing  Post-op Assessment: Report given to RN and Post -op Vital signs reviewed and stable  Post vital signs: Reviewed and stable  Last Vitals:  Vitals Value Taken Time  BP 140/94 09/16/23 11:45  Temp    Pulse 83 09/16/23 11:45  Resp 23 09/16/23 11:45  SpO2 99 % 09/16/23 11:45    Last Pain:  Vitals:   09/16/23 1145  TempSrc:   PainSc: 0-No pain         Complications: No notable events documented.

## 2023-09-16 NOTE — Anesthesia Procedure Notes (Signed)
 Date/Time: 09/16/2023 11:23 AM  Performed by: Dominica Krabbe, CRNAPre-anesthesia Checklist: Patient identified, Emergency Drugs available, Suction available, Patient being monitored and Timeout performed Patient Re-evaluated:Patient Re-evaluated prior to induction Oxygen Delivery Method: Nasal cannula Preoxygenation: Pre-oxygenation with 100% oxygen Induction Type: IV induction
